# Patient Record
Sex: Male | Born: 1985 | Hispanic: Yes | Marital: Single | Smoking: Current every day smoker
Health system: Southern US, Community
[De-identification: ages and names within clinical notes are randomized; demographics above are authoritative.]

## PROBLEM LIST (undated history)

## (undated) ENCOUNTER — Emergency Department (HOSPITAL_COMMUNITY): Admission: EM | Payer: Self-pay | Source: Home / Self Care

## (undated) DIAGNOSIS — I10 Essential (primary) hypertension: Secondary | ICD-10-CM

---

## 2012-04-24 ENCOUNTER — Encounter (HOSPITAL_COMMUNITY): Payer: Self-pay | Admitting: Emergency Medicine

## 2012-04-24 ENCOUNTER — Emergency Department (HOSPITAL_COMMUNITY)
Admission: EM | Admit: 2012-04-24 | Discharge: 2012-04-25 | Disposition: A | Discharge status: Home / Self Care | Payer: Self-pay | Attending: Emergency Medicine | Admitting: Emergency Medicine

## 2012-04-24 DIAGNOSIS — I10 Essential (primary) hypertension: Secondary | ICD-10-CM | POA: Insufficient documentation

## 2012-04-24 DIAGNOSIS — R51 Headache: Secondary | ICD-10-CM | POA: Insufficient documentation

## 2012-04-24 LAB — BASIC METABOLIC PANEL WITH GFR
BUN: 18 mg/dL (ref 6–23)
CO2: 26 meq/L (ref 19–32)
Calcium: 10.2 mg/dL (ref 8.4–10.5)
Chloride: 104 meq/L (ref 96–112)
Creatinine, Ser: 0.88 mg/dL (ref 0.50–1.35)
GFR calc Af Amer: >90 mL/min
GFR calc non Af Amer: >90 mL/min
Glucose, Bld: 107 mg/dL — ABNORMAL HIGH (ref 70–99)
Potassium: 3.8 meq/L (ref 3.5–5.1)
Sodium: 141 meq/L (ref 135–145)

## 2012-04-24 LAB — CBC
HCT: 44 % (ref 39.0–52.0)
Hemoglobin: 16.3 g/dL (ref 13.0–17.0)
RBC: 5.1 MIL/uL (ref 4.22–5.81)

## 2012-04-24 MED ORDER — KETOROLAC TROMETHAMINE 30 MG/ML IJ SOLN
30.0000 mg | Freq: Once | INTRAMUSCULAR | Status: AC
  Administered 2012-04-24: 30 mg via INTRAVENOUS
  Filled 2012-04-24: qty 1

## 2012-04-24 MED ORDER — HYDROCHLOROTHIAZIDE 25 MG PO TABS: 25.0000 mg | ORAL_TABLET | Freq: Every day | ORAL | Status: AC

## 2012-04-24 MED ORDER — DIPHENHYDRAMINE HCL 50 MG/ML IJ SOLN
25.0000 mg | Freq: Once | INTRAMUSCULAR | Status: AC
  Administered 2012-04-24: 25 mg via INTRAVENOUS
  Filled 2012-04-24: qty 1

## 2012-04-24 MED ORDER — IBUPROFEN 600 MG PO TABS: 600.0000 mg | ORAL_TABLET | Freq: Four times a day (QID) | ORAL | Status: AC | PRN

## 2012-04-24 MED ORDER — METOCLOPRAMIDE HCL 5 MG/ML IJ SOLN
10.0000 mg | Freq: Once | INTRAMUSCULAR | Status: AC
  Administered 2012-04-24: 10 mg via INTRAVENOUS
  Filled 2012-04-24: qty 2

## 2012-04-24 MED ORDER — SODIUM CHLORIDE 0.9 % IV BOLUS (SEPSIS)
1000.0000 mL | Freq: Once | INTRAVENOUS | Status: AC
  Administered 2012-04-24: 1000 mL via INTRAVENOUS

## 2012-04-24 MED ORDER — HYDROCODONE-ACETAMINOPHEN 5-325 MG PO TABS: 1.0000 | ORAL_TABLET | Freq: Four times a day (QID) | ORAL | Status: AC | PRN

## 2012-04-24 NOTE — ED Notes (Signed)
Pt. States he is suppose to be taking B/P meds. He went to a clinic a month ago and was placed on B/p meds but he has run out now. He is here because he knows his b/P is elevated and it ia causing his headache. He states it is not as bad as it was earlier.

## 2012-04-24 NOTE — ED Notes (Signed)
Pt states HA located on top of head posteriorly. States hx of high BP. Pt stopped taking his BP medication.

## 2012-04-24 NOTE — ED Provider Notes (Signed)
History     CSN: [id number]  Arrival date & time 04/24/12  2128   First MD Initiated Contact with Patient 04/24/12 2225      Chief Complaint  Patient presents with  . Headache    (Consider location/radiation/quality/duration/timing/severity/associated sxs/prior treatment) HPI Comments: Pt comes in with cc of headaches. Pt has hx of HTN, with non compliance. Pt states that his headaches started this evening, and gradually worsened. The headaches are located on the top of his head. He has No nausea, vomiting, visual complains, seizures, altered mental status, loss of consciousness, new weakness, or numbness, no gait instability at this time. No meds taken for the headache. Pt also felt like he was having palpitations. No dizziness, chest pain, dib. Pt might have gotten a little diaphoretic. No substance abuse.  Patient is a 27 y.o. male presenting with headaches. The history is provided by the patient.  Headache Associated symptoms: no abdominal pain, no cough and no numbness     History reviewed. No pertinent past medical history.  History reviewed. No pertinent past surgical history.  No family history on file.  History  Substance Use Topics  . Smoking status: Not on file  . Smokeless tobacco: Not on file  . Alcohol Use: Not on file      Review of Systems  Constitutional: Negative for activity change and appetite change.  Eyes: Negative for visual disturbance.  Respiratory: Negative for cough and shortness of breath.   Cardiovascular: Negative for chest pain.  Gastrointestinal: Negative for abdominal pain.  Genitourinary: Negative for dysuria.  Neurological: Positive for headaches. Negative for syncope, weakness, light-headedness and numbness.    Allergies  Review of patient's allergies indicates no known allergies.  Home Medications  No current outpatient prescriptions on file.  BP 117/80  Pulse 88  Temp(Src) 98.1 F (36.7 C) (Oral)  Resp 13  SpO2  98%  Physical Exam  Nursing note and vitals reviewed. Constitutional: He is oriented to person, place, and time. He appears well-developed.  HENT:  Head: Normocephalic and atraumatic.  Eyes: Conjunctivae and EOM are normal. Pupils are equal, round, and reactive to light.  Neck: Normal range of motion. Neck supple.  Cardiovascular: Normal rate and regular rhythm.   Pulmonary/Chest: Effort normal and breath sounds normal.  Abdominal: Soft. Bowel sounds are normal. He exhibits no distension. There is no tenderness. There is no rebound and no guarding.  Neurological: He is alert and oriented to person, place, and time.  Skin: Skin is warm.    ED Course  Procedures (including critical care time)  Labs Reviewed  CBC - Abnormal; Notable for the following:    WBC 13.0 (*)    MCHC 37.0 (*)    All other components within normal limits  BASIC METABOLIC PANEL - Abnormal; Notable for the following:    Glucose, Bld 107 (*)    All other components within normal limits   No results found.   No diagnosis found.    MDM   Date: 04/24/2012  Rate: 89  Rhythm: normal sinus rhythm  QRS Axis: normal  Intervals: normal  ST/T Wave abnormalities: normal  Conduction Disutrbances: none  Narrative Interpretation: unremarkable  Pt comes in with cc of headaches. BP is WNL here. Headache are not sever, and no red flags with them. No indication for CT head. We will get EKG because he had palpitations. Will give headache meds.  11:51 PM Headache improved. Will d.c  The patient was counseled on the dangers of tobacco  use, and was advised to quit.  Reviewed strategies to maximize success, including removing cigarettes and smoking materials from environment.    Derwood Kaplan, MD 04/24/12 2351

## 2014-03-26 ENCOUNTER — Emergency Department (HOSPITAL_COMMUNITY)
Admission: EM | Admit: 2014-03-26 | Discharge: 2014-03-27 | Disposition: A | Discharge status: Home / Self Care | Payer: Self-pay | Attending: Emergency Medicine | Admitting: Emergency Medicine

## 2014-03-26 ENCOUNTER — Emergency Department (HOSPITAL_COMMUNITY): Payer: Self-pay

## 2014-03-26 ENCOUNTER — Encounter (HOSPITAL_COMMUNITY): Payer: Self-pay | Admitting: Emergency Medicine

## 2014-03-26 DIAGNOSIS — Z720 Tobacco use: Secondary | ICD-10-CM | POA: Insufficient documentation

## 2014-03-26 DIAGNOSIS — Z79899 Other long term (current) drug therapy: Secondary | ICD-10-CM | POA: Insufficient documentation

## 2014-03-26 DIAGNOSIS — R519 Headache, unspecified: Secondary | ICD-10-CM

## 2014-03-26 DIAGNOSIS — R51 Headache: Secondary | ICD-10-CM | POA: Insufficient documentation

## 2014-03-26 DIAGNOSIS — I10 Essential (primary) hypertension: Secondary | ICD-10-CM | POA: Insufficient documentation

## 2014-03-26 HISTORY — DX: Essential (primary) hypertension: I10

## 2014-03-26 LAB — CBC
HCT: 46.4 % (ref 39.0–52.0)
Hemoglobin: 16.4 g/dL (ref 13.0–17.0)
MCH: 31.6 pg (ref 26.0–34.0)
MCHC: 35.3 g/dL (ref 30.0–36.0)
MCV: 89.4 fL (ref 78.0–100.0)
Platelets: 299 10*3/uL (ref 150–400)
RBC: 5.19 MIL/uL (ref 4.22–5.81)
RDW: 12.1 % (ref 11.5–15.5)
WBC: 11.5 10*3/uL — ABNORMAL HIGH (ref 4.0–10.5)

## 2014-03-26 LAB — BASIC METABOLIC PANEL
ANION GAP: 9 (ref 5–15)
BUN: 13 mg/dL (ref 6–23)
CALCIUM: 10.2 mg/dL (ref 8.4–10.5)
CO2: 28 mmol/L (ref 19–32)
CREATININE: 1.04 mg/dL (ref 0.50–1.35)
Chloride: 104 mmol/L (ref 96–112)
GFR calc non Af Amer: >90 mL/min (ref >90)
Glucose, Bld: 114 mg/dL — ABNORMAL HIGH (ref 70–99)
Potassium: 3.5 mmol/L (ref 3.5–5.1)
Sodium: 141 mmol/L (ref 135–145)

## 2014-03-26 NOTE — ED Notes (Signed)
Patient presents with a left side headache and overall weakness, lack of appetite, feeling shortness of breath in the a.m., nose bleeding in a.m. Today about two 1945 the headache became worse and he decided to come be seen.

## 2014-03-27 ENCOUNTER — Emergency Department (HOSPITAL_COMMUNITY): Payer: Self-pay

## 2014-03-27 MED ORDER — KETOROLAC TROMETHAMINE 30 MG/ML IJ SOLN
30.0000 mg | Freq: Once | INTRAMUSCULAR | Status: AC
  Administered 2014-03-27: 30 mg via INTRAVENOUS
  Filled 2014-03-27: qty 1

## 2014-03-27 MED ORDER — PREDNISONE 50 MG PO TABS: 50.0000 mg | ORAL_TABLET | Freq: Every day | ORAL | Status: AC

## 2014-03-27 MED ORDER — DEXAMETHASONE SODIUM PHOSPHATE 10 MG/ML IJ SOLN
10.0000 mg | Freq: Once | INTRAMUSCULAR | Status: AC
  Administered 2014-03-27: 10 mg via INTRAVENOUS
  Filled 2014-03-27: qty 1

## 2014-03-27 MED ORDER — BUTALBITAL-APAP-CAFFEINE 50-325-40 MG PO TABS: 1.0000 | ORAL_TABLET | Freq: Four times a day (QID) | ORAL | Status: AC | PRN

## 2014-03-27 MED ORDER — SODIUM CHLORIDE 0.9 % IV BOLUS (SEPSIS)
1000.0000 mL | Freq: Once | INTRAVENOUS | Status: AC
  Administered 2014-03-27: 1000 mL via INTRAVENOUS

## 2014-03-27 NOTE — ED Provider Notes (Signed)
CSN: [id number]     Arrival date & time 03/26/14  2139 History   First MD Initiated Contact with Patient 03/26/14 2354     Chief Complaint  Patient presents with  . Headache     (Consider location/radiation/quality/duration/timing/severity/associated sxs/prior Treatment) HPI Patient presents to the emergency department with left-sided headache.  This been ongoing for the last 4-6 weeks.  The patient states that he has a constant headache that seems to fluctuate from a more intense headache to a minor headache.  The patient states that nothing seems make the condition better or worse.  He states that he does have some overall weakness and decreased appetite.  Patient states that he did not take any medications prior to arrival.  The patient states that he has been seen for headache several years ago.  Patient denies blurred vision, numbness, dysuria, incontinence, back pain, neck pain, fever, chest pain, shortness of breath, abdominal pain, nausea, vomiting, diarrhea, or syncope Past Medical History  Diagnosis Date  . Hypertension    History reviewed. No pertinent past surgical history. History reviewed. No pertinent family history. History  Substance Use Topics  . Smoking status: Current Every Day Smoker -- 1.00 packs/day for 15 years    Types: Cigarettes  . Smokeless tobacco: Never Used  . Alcohol Use: 1.8 oz/week    3 Cans of beer per week    Review of Systems  All other systems negative except as documented in the HPI. All pertinent positives and negatives as reviewed in the HPI.yew  Allergies  Review of patient's allergies indicates no known allergies.  Home Medications   Prior to Admission medications   Medication Sig Start Date End Date Taking? Authorizing Provider  hydrochlorothiazide (HYDRODIURIL) 25 MG tablet Take 1 tablet (25 mg total) by mouth daily. 04/24/12   Derwood Kaplan, MD  HYDROcodone-acetaminophen (NORCO/VICODIN) 5-325 MG per tablet Take 1 tablet by mouth  every 6 (six) hours as needed for pain. 04/24/12   Derwood Kaplan, MD  ibuprofen (ADVIL,MOTRIN) 600 MG tablet Take 1 tablet (600 mg total) by mouth every 6 (six) hours as needed for pain. 04/24/12   Ankit Nanavati, MD   BP 130/84 mmHg  Pulse 68  Temp(Src) 98.2 F (36.8 C) (Oral)  Resp 22  Ht  (1.778 m)  Wt 171 lb 1 oz (77.593 kg)  BMI 24.54 kg/m2  SpO2 100% Physical Exam  Constitutional: He is oriented to person, place, and time. He appears well-developed and well-nourished. No distress.  HENT:  Head: Normocephalic and atraumatic.  Mouth/Throat: Oropharynx is clear and moist.  Eyes: Pupils are equal, round, and reactive to light.  Neck: Normal range of motion. Neck supple.  Cardiovascular: Normal rate, regular rhythm and normal heart sounds.   Pulmonary/Chest: Effort normal and breath sounds normal. No respiratory distress.  Neurological: He is alert and oriented to person, place, and time. He displays normal reflexes. No cranial nerve deficit or sensory deficit. He exhibits normal muscle tone. Coordination normal. GCS eye subscore is 4. GCS verbal subscore is 5. GCS motor subscore is 6.  Patient has mild strength weakness in his left hand.  I would say that is 4 out of 5 and 4/5 in his left leg  Skin: Skin is warm and dry. No rash noted. No erythema.  Nursing note and vitals reviewed.   ED Course  Procedures (including critical care time) Labs Review Labs Reviewed  BASIC METABOLIC PANEL - Abnormal; Notable for the following:    Glucose, Bld 114 (*)  All other components within normal limits  CBC - Abnormal; Notable for the following:    WBC 11.5 (*)    All other components within normal limits    Imaging Review Dg Chest 2 View  03/26/2014   CLINICAL DATA:  Recurrent chronic chest pain, headache and back pain for 4-5 weeks. Initial encounter.  EXAM: CHEST  2 VIEW  COMPARISON:  None.  FINDINGS: The lungs are well-aerated and clear. There is no evidence of focal  opacification, pleural effusion or pneumothorax.  The heart is normal in size; the mediastinal contour is within normal limits. No acute osseous abnormalities are seen.  IMPRESSION: No acute cardiopulmonary process seen.   Electronically Signed   By: Roanna RaiderJeffery  Chang M.D.   On: 03/26/2014 23:27   Ct Head Wo Contrast  03/27/2014   CLINICAL DATA:  Gradually worsening severe LEFT-sided beginning this evening. Heart palpitations.  EXAM: CT HEAD WITHOUT CONTRAST  TECHNIQUE: Contiguous axial images were obtained from the base of the skull through the vertex without intravenous contrast.  COMPARISON:  None.  FINDINGS: The ventricles and sulci are normal. No intraparenchymal hemorrhage, mass effect nor midline shift. No acute large vascular territory infarcts.  No abnormal extra-axial fluid collections. Basal cisterns are patent.  No skull fracture. The included ocular globes and orbital contents are non-suspicious. The mastoid aircells and included paranasal sinuses are well-aerated.  IMPRESSION: No acute intracranial process ; normal noncontrast CT of the head.   Electronically Signed   By: Awilda Metroourtnay  Bloomer   On: 03/27/2014 01:22    The patient will be given follow-up with neurology for further evaluation of this symptomatology.  The patient is advised of the need to return here as needed.  Told to increase his fluid intake and rest as much as possible.   Charlestine NightChristopher Jamelle Noy, PA-C 03/27/14 0147  Loren Raceravid Yelverton, MD 03/28/14 [phone]

## 2014-03-27 NOTE — Discharge Instructions (Signed)
Return here as needed.  You will need to [facility] at [facility] as they can help you with neurological follow-up.  They take patients without insurance and help with resources

## 2016-09-16 IMAGING — CT CT HEAD W/O CM
1 series · 16 of 30 positions shown, 20 images · non-contrast
Comparison: None.

CLINICAL DATA: Gradually worsening severe LEFT-sided beginning this
evening. Heart palpitations.

EXAM:
CT HEAD WITHOUT CONTRAST
TECHNIQUE: Contiguous axial images were obtained from the base of the skull
through the vertex without intravenous contrast.

[Series 2: head 5.0 h31s · axial · 0.44mm/px · z∈[-220,-75]mm · 16 of 33 slices shown, 20 images]
[im 2/33  brain]
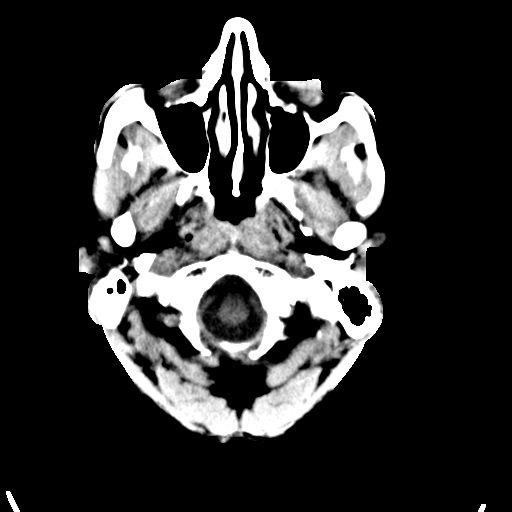
[im 2/33  bone]
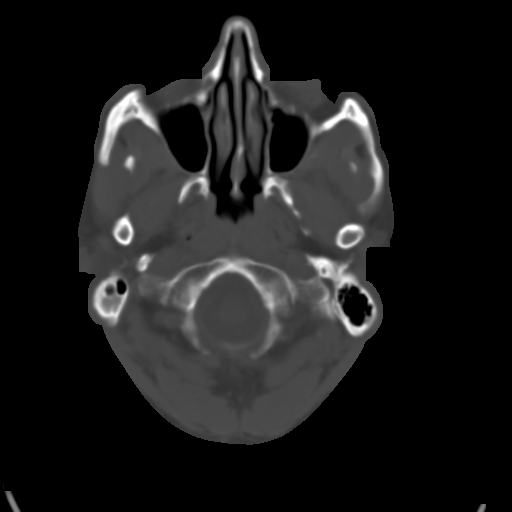
[im 4/33  brain]
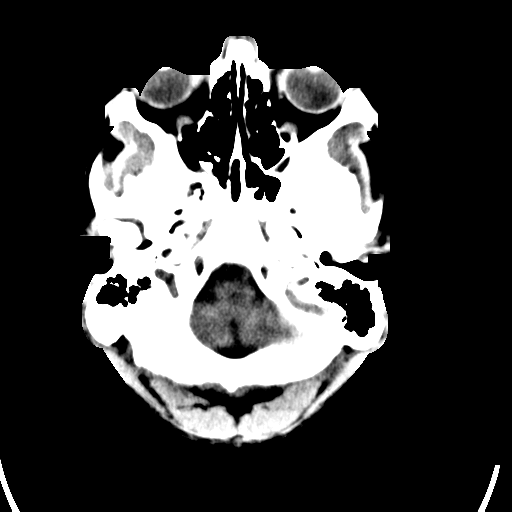
[im 6/33  brain]
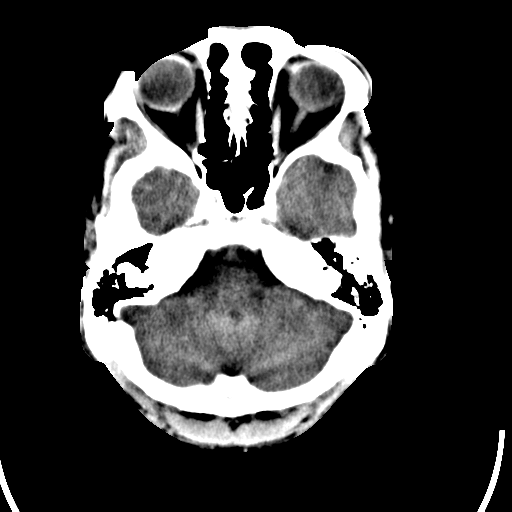
[im 8/33  brain]
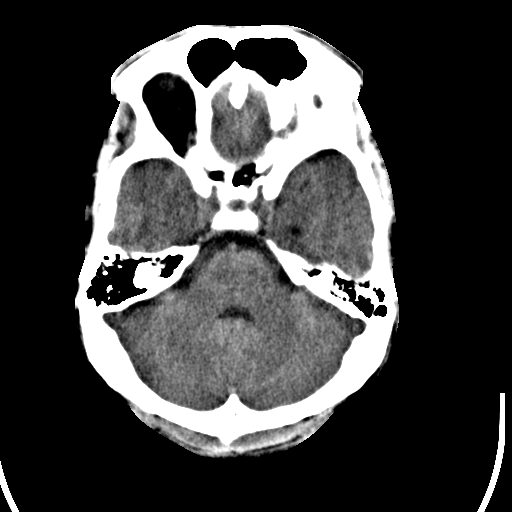
[im 9/33  brain]
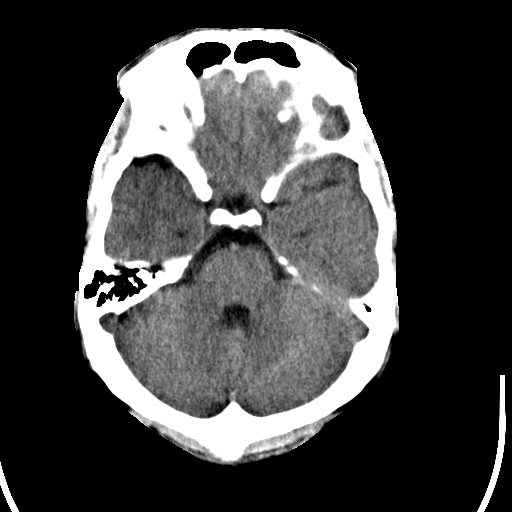
[im 9/33  bone]
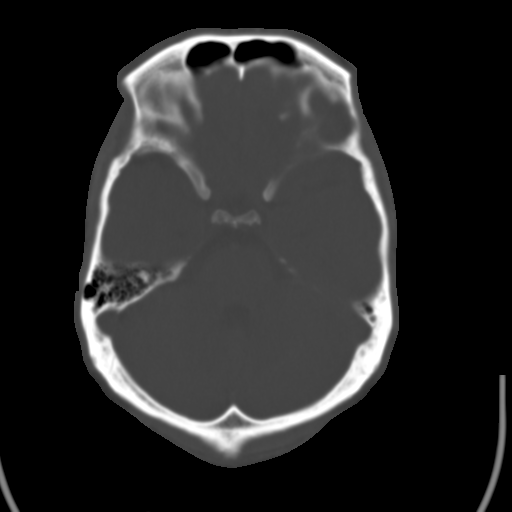
[im 12/33  brain]
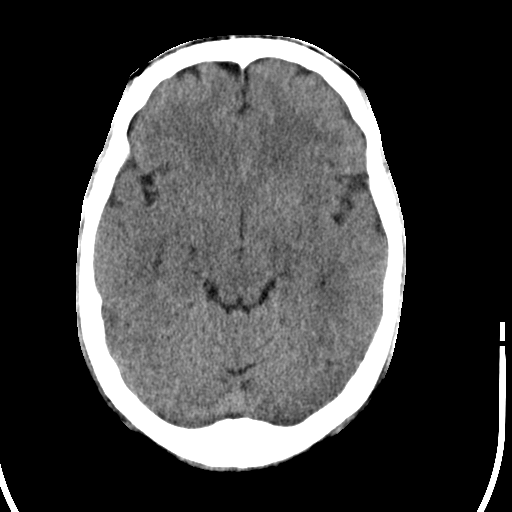
[im 14/33  brain]
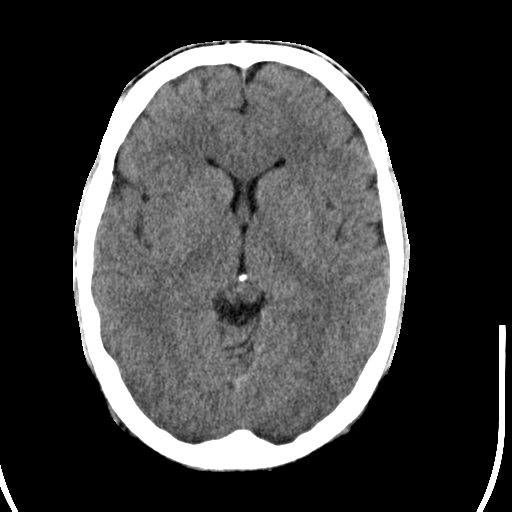
[im 16/33  brain]
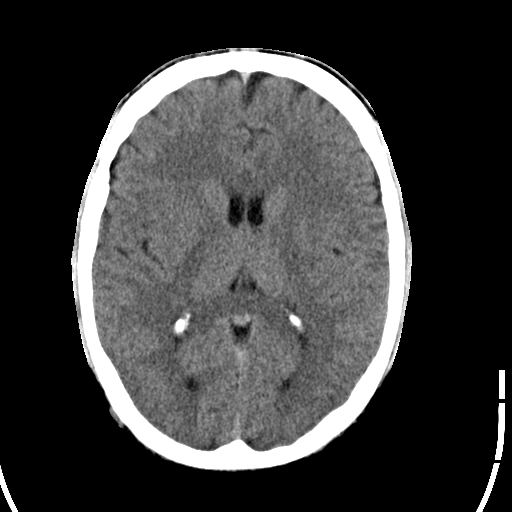
[im 17/33  brain]
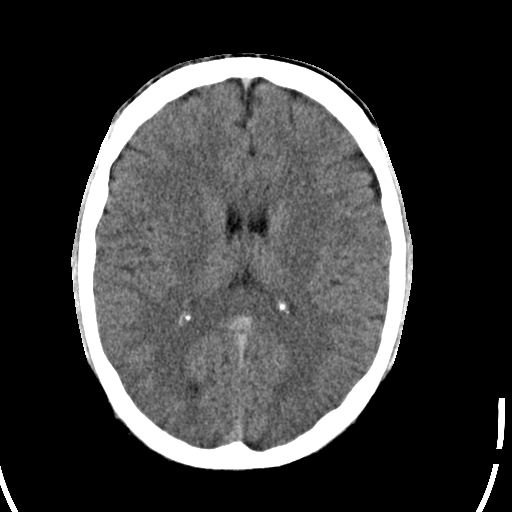
[im 17/33  bone]
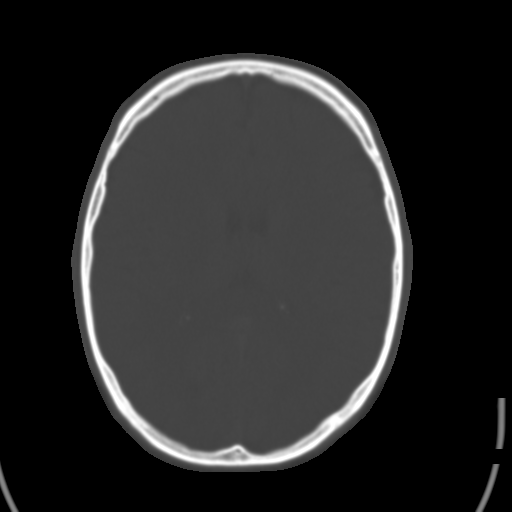
[im 19/33  brain]
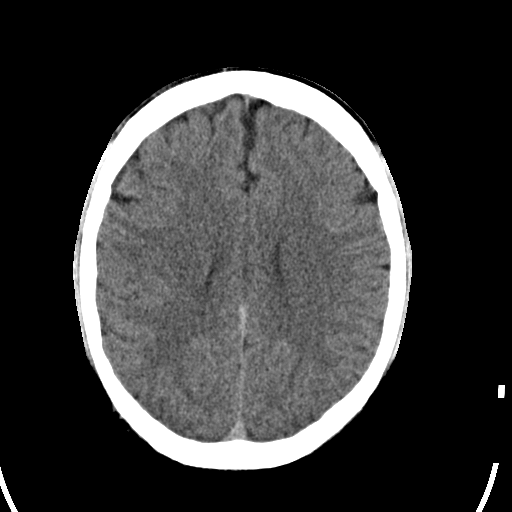
[im 21/33  brain]
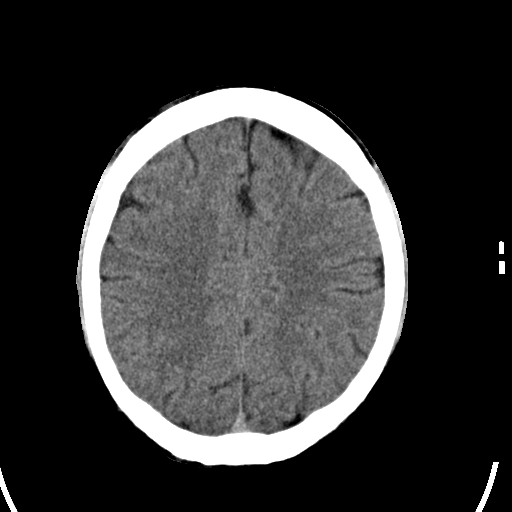
[im 24/33  brain]
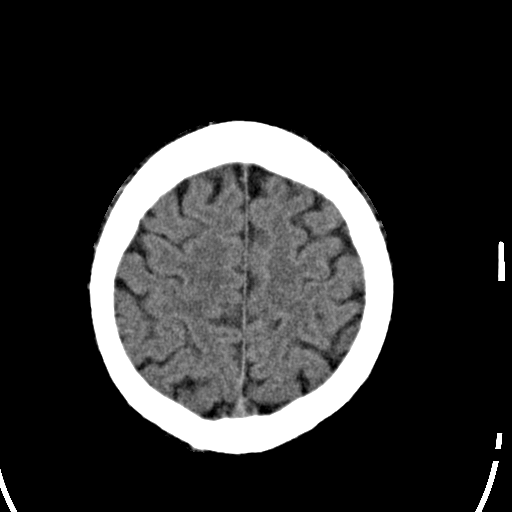
[im 25/33  brain]
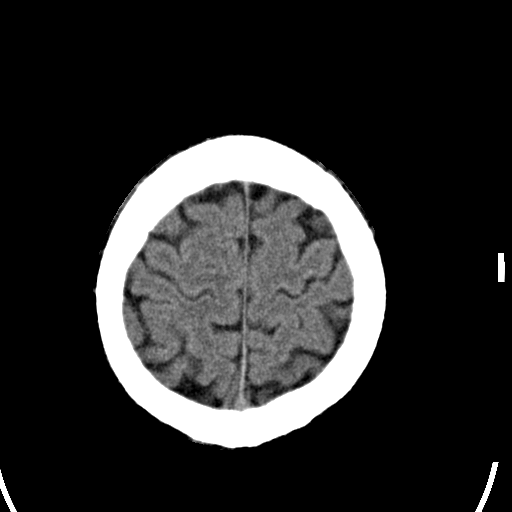
[im 25/33  bone]
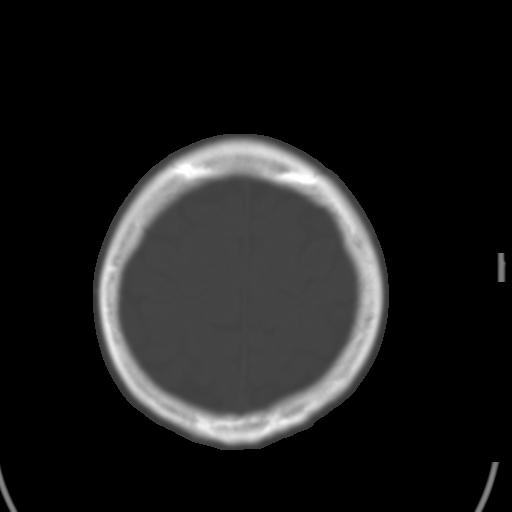
[im 27/33  brain]
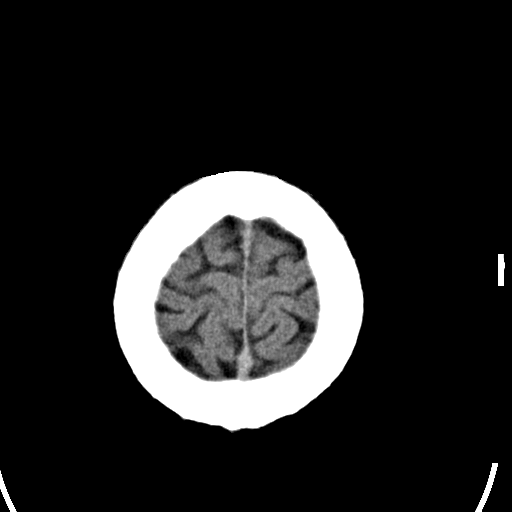
[im 29/33  brain]
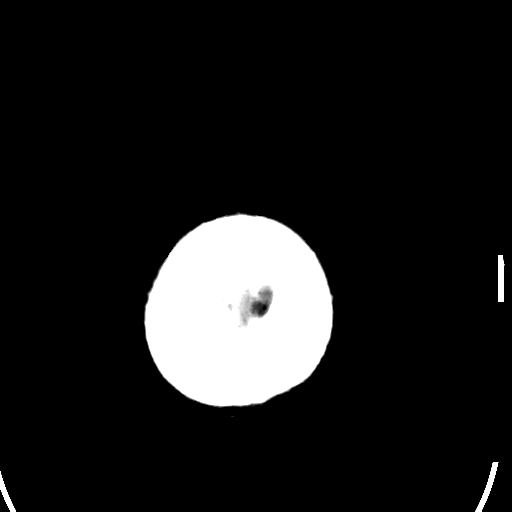
[im 31/33  brain]
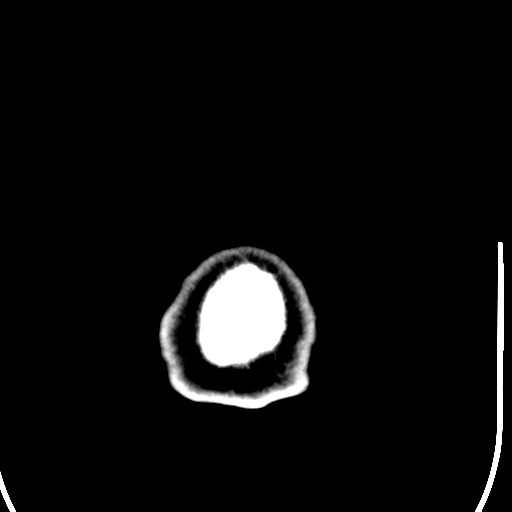

[16 of 30 positions shown; findings below may reference images not displayed]

FINDINGS: The ventricles and sulci are normal. No intraparenchymal hemorrhage,
mass effect nor midline shift. No acute large vascular territory
infarcts.

No abnormal extra-axial fluid collections. Basal cisterns are
patent.

No skull fracture. The included ocular globes and orbital contents
are non-suspicious. The mastoid aircells and included paranasal
sinuses are well-aerated.
IMPRESSION: No acute intracranial process ; normal noncontrast CT of the head.

  By: Kannan Bhat

## 2020-02-01 ENCOUNTER — Emergency Department (HOSPITAL_COMMUNITY): Admission: EM | Admit: 2020-02-01 | Discharge: 2020-02-01 | Discharge status: Absent without leave | Payer: Self-pay

## 2020-02-01 ENCOUNTER — Other Ambulatory Visit: Payer: Self-pay
